# Patient Record
Sex: Female | Born: 1935 | Race: White | Hispanic: No | Smoking: Never smoker
Health system: Southern US, Community
[De-identification: ages and names within clinical notes are randomized; demographics above are authoritative.]

---

## 2014-05-23 ENCOUNTER — Emergency Department (HOSPITAL_COMMUNITY): Payer: Medicare HMO | Admitting: Anesthesiology

## 2014-05-23 ENCOUNTER — Encounter (HOSPITAL_COMMUNITY): Payer: Self-pay | Admitting: Emergency Medicine

## 2014-05-23 ENCOUNTER — Emergency Department (HOSPITAL_COMMUNITY): Payer: Medicare HMO

## 2014-05-23 ENCOUNTER — Encounter (HOSPITAL_COMMUNITY): Payer: Medicare HMO | Admitting: Anesthesiology

## 2014-05-23 ENCOUNTER — Encounter (HOSPITAL_COMMUNITY)
Admission: EM | Disposition: A | Discharge status: Home / Self Care | Payer: Self-pay | Source: Home / Self Care | Attending: Emergency Medicine

## 2014-05-23 ENCOUNTER — Ambulatory Visit (HOSPITAL_COMMUNITY)
Admission: EM | Admit: 2014-05-23 | Discharge: 2014-05-23 | Disposition: A | Discharge status: Home / Self Care | Payer: Medicare HMO | Attending: Emergency Medicine | Admitting: Emergency Medicine

## 2014-05-23 DIAGNOSIS — Y92009 Unspecified place in unspecified non-institutional (private) residence as the place of occurrence of the external cause: Secondary | ICD-10-CM | POA: Diagnosis not present

## 2014-05-23 DIAGNOSIS — W108XXA Fall (on) (from) other stairs and steps, initial encounter: Secondary | ICD-10-CM | POA: Diagnosis not present

## 2014-05-23 DIAGNOSIS — S62102B Fracture of unspecified carpal bone, left wrist, initial encounter for open fracture: Secondary | ICD-10-CM

## 2014-05-23 DIAGNOSIS — Z885 Allergy status to narcotic agent status: Secondary | ICD-10-CM | POA: Diagnosis not present

## 2014-05-23 DIAGNOSIS — S52509B Unspecified fracture of the lower end of unspecified radius, initial encounter for open fracture type I or II: Secondary | ICD-10-CM | POA: Insufficient documentation

## 2014-05-23 DIAGNOSIS — Y9301 Activity, walking, marching and hiking: Secondary | ICD-10-CM | POA: Insufficient documentation

## 2014-05-23 DIAGNOSIS — Y998 Other external cause status: Secondary | ICD-10-CM | POA: Insufficient documentation

## 2014-05-23 DIAGNOSIS — S52609B Unspecified fracture of lower end of unspecified ulna, initial encounter for open fracture type I or II: Principal | ICD-10-CM

## 2014-05-23 HISTORY — PX: OPEN REDUCTION INTERNAL FIXATION (ORIF) DISTAL RADIAL FRACTURE: SHX5989

## 2014-05-23 SURGERY — OPEN REDUCTION INTERNAL FIXATION (ORIF) DISTAL RADIUS FRACTURE
Anesthesia: General | Site: Wrist | Laterality: Left

## 2014-05-23 MED ORDER — OXYCODONE-ACETAMINOPHEN 5-325 MG PO TABS
1.0000 | ORAL_TABLET | Freq: Once | ORAL | Status: AC
  Administered 2014-05-23: 1 via ORAL
  Filled 2014-05-23: qty 1

## 2014-05-23 MED ORDER — DIPHENHYDRAMINE HCL 50 MG/ML IJ SOLN
INTRAMUSCULAR | Status: DC | PRN
  Administered 2014-05-23: 12.5 mg via INTRAVENOUS

## 2014-05-23 MED ORDER — DEXAMETHASONE SODIUM PHOSPHATE 4 MG/ML IJ SOLN
INTRAMUSCULAR | Status: DC | PRN
  Administered 2014-05-23: 8 mg via INTRAVENOUS

## 2014-05-23 MED ORDER — DEXAMETHASONE SODIUM PHOSPHATE 4 MG/ML IJ SOLN
INTRAMUSCULAR | Status: AC
  Filled 2014-05-23: qty 2

## 2014-05-23 MED ORDER — BUPIVACAINE HCL (PF) 0.25 % IJ SOLN
INTRAMUSCULAR | Status: AC
  Filled 2014-05-23: qty 30

## 2014-05-23 MED ORDER — ONDANSETRON HCL 4 MG/2ML IJ SOLN
INTRAMUSCULAR | Status: DC | PRN
  Administered 2014-05-23: 4 mg via INTRAVENOUS

## 2014-05-23 MED ORDER — SCOPOLAMINE 1 MG/3DAYS TD PT72: 1.0000 | MEDICATED_PATCH | Freq: Once | TRANSDERMAL | Status: DC

## 2014-05-23 MED ORDER — OXYCODONE HCL 5 MG/5ML PO SOLN: 5.0000 mg | Freq: Once | ORAL | Status: DC | PRN

## 2014-05-23 MED ORDER — PROPOFOL 10 MG/ML IV BOLUS
INTRAVENOUS | Status: AC
  Filled 2014-05-23: qty 20

## 2014-05-23 MED ORDER — MEPERIDINE HCL 25 MG/ML IJ SOLN: 6.2500 mg | INTRAMUSCULAR | Status: DC | PRN

## 2014-05-23 MED ORDER — LACTATED RINGERS IV SOLN
INTRAVENOUS | Status: DC | PRN
  Administered 2014-05-23 (×2): via INTRAVENOUS

## 2014-05-23 MED ORDER — FENTANYL CITRATE 0.05 MG/ML IJ SOLN
INTRAMUSCULAR | Status: AC
  Filled 2014-05-23: qty 5

## 2014-05-23 MED ORDER — CEFAZOLIN SODIUM 1-5 GM-% IV SOLN
INTRAVENOUS | Status: DC | PRN
  Administered 2014-05-23: 1 g via INTRAVENOUS

## 2014-05-23 MED ORDER — DIPHENHYDRAMINE HCL 50 MG/ML IJ SOLN: 12.5000 mg | Freq: Once | INTRAMUSCULAR | Status: DC

## 2014-05-23 MED ORDER — CEFAZOLIN SODIUM 1-5 GM-% IV SOLN
1.0000 g | Freq: Once | INTRAVENOUS | Status: AC
  Administered 2014-05-23: 1 g via INTRAVENOUS
  Filled 2014-05-23: qty 50

## 2014-05-23 MED ORDER — MORPHINE SULFATE 4 MG/ML IJ SOLN: 4.0000 mg | Freq: Once | INTRAMUSCULAR | Status: DC

## 2014-05-23 MED ORDER — FENTANYL CITRATE 0.05 MG/ML IJ SOLN
INTRAMUSCULAR | Status: DC | PRN
  Administered 2014-05-23: 25 ug via INTRAVENOUS
  Administered 2014-05-23: 50 ug via INTRAVENOUS
  Administered 2014-05-23: 75 ug via INTRAVENOUS
  Administered 2014-05-23 (×4): 25 ug via INTRAVENOUS

## 2014-05-23 MED ORDER — PROMETHAZINE HCL 25 MG/ML IJ SOLN: 6.2500 mg | INTRAMUSCULAR | Status: DC | PRN

## 2014-05-23 MED ORDER — PROPOFOL 10 MG/ML IV BOLUS
INTRAVENOUS | Status: DC | PRN
  Administered 2014-05-23: 200 mg via INTRAVENOUS

## 2014-05-23 MED ORDER — SCOPOLAMINE 1 MG/3DAYS TD PT72
MEDICATED_PATCH | TRANSDERMAL | Status: DC | PRN
  Administered 2014-05-23: 1 via TRANSDERMAL

## 2014-05-23 MED ORDER — LIDOCAINE HCL (CARDIAC) 20 MG/ML IV SOLN
INTRAVENOUS | Status: DC | PRN
  Administered 2014-05-23: 20 mg via INTRAVENOUS

## 2014-05-23 MED ORDER — LIDOCAINE HCL (CARDIAC) 20 MG/ML IV SOLN
INTRAVENOUS | Status: AC
  Filled 2014-05-23: qty 5

## 2014-05-23 MED ORDER — HYDROMORPHONE HCL PF 1 MG/ML IJ SOLN
0.2500 mg | INTRAMUSCULAR | Status: DC | PRN
  Administered 2014-05-23 (×2): 0.5 mg via INTRAVENOUS

## 2014-05-23 MED ORDER — OXYCODONE HCL 5 MG PO TABS: 5.0000 mg | ORAL_TABLET | Freq: Once | ORAL | Status: DC | PRN

## 2014-05-23 MED ORDER — MIDAZOLAM HCL 2 MG/2ML IJ SOLN
0.5000 mg | Freq: Once | INTRAMUSCULAR | Status: DC | PRN
Start: 2014-05-23 — End: 2014-05-23

## 2014-05-23 MED ORDER — BUPIVACAINE HCL (PF) 0.25 % IJ SOLN
INTRAMUSCULAR | Status: DC | PRN
  Administered 2014-05-23: 10 mL

## 2014-05-23 MED ORDER — OXYCODONE-ACETAMINOPHEN 5-325 MG PO TABS: 1.0000 | ORAL_TABLET | ORAL | Status: AC | PRN

## 2014-05-23 SURGICAL SUPPLY — 50 items
APL SKNCLS STERI-STRIP NONHPOA (GAUZE/BANDAGES/DRESSINGS) ×1
BANDAGE ELASTIC 4 VELCRO ST LF (GAUZE/BANDAGES/DRESSINGS) ×3 IMPLANT
BENZOIN TINCTURE PRP APPL 2/3 (GAUZE/BANDAGES/DRESSINGS) ×3 IMPLANT
BIT DRILL 2.2 SS TIBIAL (BIT) ×3 IMPLANT
BNDG CMPR 9X4 STRL LF SNTH (GAUZE/BANDAGES/DRESSINGS) ×1
BNDG ESMARK 4X9 LF (GAUZE/BANDAGES/DRESSINGS) ×3 IMPLANT
BNDG GAUZE ELAST 4 BULKY (GAUZE/BANDAGES/DRESSINGS) ×3 IMPLANT
CLOSURE WOUND 1/2 X4 (GAUZE/BANDAGES/DRESSINGS) ×1
CORDS BIPOLAR (ELECTRODE) ×3 IMPLANT
COVER SURGICAL LIGHT HANDLE (MISCELLANEOUS) ×3 IMPLANT
CUFF TOURNIQUET SINGLE 18IN (TOURNIQUET CUFF) ×3 IMPLANT
DRAPE OEC MINIVIEW 54X84 (DRAPES) ×3 IMPLANT
DRAPE SURG 17X23 STRL (DRAPES) ×3 IMPLANT
GAUZE SPONGE 4X4 12PLY STRL (GAUZE/BANDAGES/DRESSINGS) ×3 IMPLANT
GAUZE XEROFORM 1X8 LF (GAUZE/BANDAGES/DRESSINGS) ×3 IMPLANT
GLOVE BIO SURGEON STRL SZ8.5 (GLOVE) ×3 IMPLANT
GOWN STRL REUS W/ TWL LRG LVL3 (GOWN DISPOSABLE) ×1 IMPLANT
GOWN STRL REUS W/ TWL XL LVL3 (GOWN DISPOSABLE) ×1 IMPLANT
GOWN STRL REUS W/TWL LRG LVL3 (GOWN DISPOSABLE) ×3
GOWN STRL REUS W/TWL XL LVL3 (GOWN DISPOSABLE) ×3
KIT BASIN OR (CUSTOM PROCEDURE TRAY) ×3 IMPLANT
KIT ROOM TURNOVER OR (KITS) ×3 IMPLANT
MANIFOLD NEPTUNE II (INSTRUMENTS) ×3 IMPLANT
NEEDLE HYPO 25GX1X1/2 BEV (NEEDLE) ×3 IMPLANT
NS IRRIG 1000ML POUR BTL (IV SOLUTION) ×3 IMPLANT
PACK ORTHO EXTREMITY (CUSTOM PROCEDURE TRAY) ×3 IMPLANT
PAD ARMBOARD 7.5X6 YLW CONV (MISCELLANEOUS) ×6 IMPLANT
PAD CAST 3X4 CTTN HI CHSV (CAST SUPPLIES) ×1 IMPLANT
PAD CAST 4YDX4 CTTN HI CHSV (CAST SUPPLIES) ×1 IMPLANT
PADDING CAST COTTON 3X4 STRL (CAST SUPPLIES) ×3
PADDING CAST COTTON 4X4 STRL (CAST SUPPLIES) ×3
PEG LOCKING SMOOTH 2.2X18 (Peg) ×3 IMPLANT
PEG LOCKING SMOOTH 2.2X20 (Screw) ×12 IMPLANT
PEG LOCKING SMOOTH 2.2X22 (Screw) ×6 IMPLANT
PLATE STANDARD DVR LEFT (Plate) ×3 IMPLANT
PLATE STD DVR LT 24X51 (Plate) ×1 IMPLANT
SCREW  LP NL 2.7X10MM (Screw) ×4 IMPLANT
SCREW 2.7X12MM (Screw) ×3 IMPLANT
SCREW LP NL 2.7X10MM (Screw) ×2 IMPLANT
SPONGE GAUZE 4X4 12PLY STER LF (GAUZE/BANDAGES/DRESSINGS) ×3 IMPLANT
SPONGE SCRUB IODOPHOR (GAUZE/BANDAGES/DRESSINGS) ×3 IMPLANT
STRIP CLOSURE SKIN 1/2X4 (GAUZE/BANDAGES/DRESSINGS) ×2 IMPLANT
SUT PROLENE 3 0 PS 2 (SUTURE) ×3 IMPLANT
SUT VIC AB 3-0 FS2 27 (SUTURE) IMPLANT
SUT VICRYL 4-0 PS2 18IN ABS (SUTURE) ×3 IMPLANT
SYR CONTROL 10ML LL (SYRINGE) ×3 IMPLANT
TOWEL OR 17X24 6PK STRL BLUE (TOWEL DISPOSABLE) ×3 IMPLANT
TOWEL OR 17X26 10 PK STRL BLUE (TOWEL DISPOSABLE) ×3 IMPLANT
UNDERPAD 30X30 INCONTINENT (UNDERPADS AND DIAPERS) ×3 IMPLANT
WATER STERILE IRR 1000ML POUR (IV SOLUTION) ×3 IMPLANT

## 2014-05-23 NOTE — Anesthesia Procedure Notes (Signed)
Procedure Name: LMA Insertion Date/Time: 05/23/2014 4:17 PM Performed by: Alanda AmassFRIEDMAN, Aparna Vanderweele A Pre-anesthesia Checklist: Patient identified, Timeout performed, Emergency Drugs available, Suction available and Patient being monitored Patient Re-evaluated:Patient Re-evaluated prior to inductionOxygen Delivery Method: Circle system utilized Preoxygenation: Pre-oxygenation with 100% oxygen Intubation Type: IV induction Ventilation: Mask ventilation without difficulty LMA: LMA inserted LMA Size: 4.0 Number of attempts: 1 Placement Confirmation: positive ETCO2 and breath sounds checked- equal and bilateral Tube secured with: Tape Dental Injury: Teeth and Oropharynx as per pre-operative assessment

## 2014-05-23 NOTE — Op Note (Signed)
See note [phone]

## 2014-05-23 NOTE — ED Provider Notes (Signed)
CSN: [id number]     Arrival date & time 05/23/14  1246 History  This chart was scribed for a non-physician practitioner, Wynetta Emery, working with Doug Sou, MD by [place] Peace, ED Scribe. The patient was seen in TR09C/TR09C. The patient's care was started at 1:11 PM.    Chief Complaint  Patient presents with  . Wrist Pain  . Fall      Patient is a 80 y.o. female presenting with wrist pain and fall. The history is provided by the patient. No language interpreter was used.  Wrist Pain  Fall  HPI Comments: Marchia Diguglielmo is a 80 y.o. female who presents to the Emergency Department complaining of fall earlier today from that resulted in her injuring her left wrist. Patient was 4 steps up and she slipped over a dog that was underfoot.  Pt reports some noted deformity with associated minor abrasions to her left arm and right ankle. She denies taking any medication pta for pain. Pt further denies hitting her head at any point during fall. Pain is 8/10. Pt further reports history of osteoporosis and scoliosis. Pt is right hand dominate. Pt denies anticoagulation, head trauma, LOC, N/V, change in vision, cervicalgia, chest pain, SOB, abdominal pain, difficulty ambulating, numbness, weakness, EtOH/illicit drug/perscription drug use that would alter awareness.    History reviewed. No pertinent past medical history. History reviewed. No pertinent past surgical history. History reviewed. No pertinent family history. History  Substance Use Topics  . Smoking status: Never Smoker   . Smokeless tobacco: Not on file  . Alcohol Use: Yes     Comment: occ   OB History   Grav Para Term Preterm Abortions TAB SAB Ect Mult Living                 Review of Systems  Constitutional: Negative for fever and chills.  Musculoskeletal:       Wrist deformity.   Skin: Positive for wound.  Neurological: Negative for syncope.   A complete 10 system review of systems was obtained and all systems are  negative except as noted in the HPI and PMH.     Allergies  Tylenol with codeine #3  Home Medications   Prior to Admission medications   Medication Sig Start Date End Date Taking? Authorizing Provider  Vitamin D, Cholecalciferol, 1000 UNITS TABS Take 2,000 Units by mouth every morning.   Yes Historical Provider, MD   BP 102/43  Pulse 60  Temp(Src) 97.7 F (36.5 C) (Oral)  Resp 18  SpO2 100% Physical Exam  Nursing note and vitals reviewed. Constitutional: She is oriented to person, place, and time. She appears well-developed and well-nourished.  HENT:  Head: Normocephalic and atraumatic.  Mouth/Throat: Oropharynx is clear and moist.  No abrasions or contusions.   No hemotympanum, battle signs or raccoon's eyes  No crepitance or tenderness to palpation along the orbital rim.  EOMI intact with no pain or diplopia  No abnormal otorrhea or rhinorrhea. Nasal septum midline.  No intraoral trauma.  Eyes: Conjunctivae and EOM are normal. Pupils are equal, round, and reactive to light.  Neck: Normal range of motion. Neck supple.  No midline C-spine  tenderness to palpation or step-offs appreciated. Patient has full range of motion without pain.   Cardiovascular: Normal rate, regular rhythm and intact distal pulses.   Pulmonary/Chest: Effort normal and breath sounds normal. No respiratory distress. She has no wheezes. She has no rales. She exhibits no tenderness.  No  TTP or crepitance  Abdominal: Soft. Bowel sounds are normal. She exhibits no distension and no mass. There is no tenderness. There is no rebound and no guarding.  Musculoskeletal: Normal range of motion. She exhibits no edema and no tenderness.  Deformity to left wrist. Patient can move fingers. Cap Refill is less than 2 seconds x5 digits. Distal sensation is grossly intact. There is a 5 mm abrasion to radial side of wrist. It does not appear to communicate deeply.  Neurological: She is alert and oriented to person,  place, and time.  Strength 5/5 x4 extremities   Distal sensation intact  Skin: Skin is warm.  Psychiatric: She has a normal mood and affect.    ED Course  Procedures (including critical care time)  No results found for this or any previous visit. No results found.    Labs Review Labs Reviewed - No data to display  Imaging Review Dg Wrist 2 Views Left  05/23/2014   CLINICAL DATA:  Larey Seat down stairs, wrist pain  EXAM: LEFT WRIST - 2 VIEW  COMPARISON:  None.  FINDINGS: Acute displaced and comminuted distal radius fracture. Additionally, there is an acute and displaced fracture through the distal ulna. Fracture fragments are displaced dorsally with apex volar angulation. Additionally, there is override of the fracture fragments. Comminution of the distal radius appears to involve the articular surface. The carpal bones travel dorsally with the distal radius.  IMPRESSION: 1. Acute displaced and comminuted distal radius fracture with probable articular involvement. The distal fracture fragments are displaced dorsally and there is apex volar angulation of the fracture site. 2. Acute minimally displaced fracture of the distal ulna.   Electronically Signed   By: Malachy Moan M.D.   On: 05/23/2014 14:38     EKG Interpretation None     Medications  morphine 4 MG/ML injection 4 mg (4 mg Intravenous Not Given 05/23/14 1525)  bupivacaine (PF) (MARCAINE) 0.25 % injection (1 mL  Given 05/23/14 1553)  oxyCODONE-acetaminophen (PERCOCET/ROXICET) 5-325 MG per tablet 1 tablet (1 tablet Oral Given 05/23/14 1319)  ceFAZolin (ANCEF) IVPB 1 g/50 mL premix (0 g Intravenous Stopped 05/23/14 1528)    1:15 PM- Treatment plan was discussed with patient who verbalizes understanding and agrees.   MDM   Final diagnoses:  Wrist fracture, left, open, initial encounter    Filed Vitals:   05/23/14 1257 05/23/14 1522  BP: 102/43 140/67  Pulse: 60 83  Temp: 97.7 F (36.5 C) 98.9 F (37.2 C)  TempSrc: Oral    Resp: 18 16  SpO2: 100% 99%    Medications  morphine 4 MG/ML injection 4 mg (4 mg Intravenous Not Given 05/23/14 1525)  bupivacaine (PF) (MARCAINE) 0.25 % injection (1 mL  Given 05/23/14 1553)  oxyCODONE-acetaminophen (PERCOCET/ROXICET) 5-325 MG per tablet 1 tablet (1 tablet Oral Given 05/23/14 1319)  ceFAZolin (ANCEF) IVPB 1 g/50 mL premix (0 g Intravenous Stopped 05/23/14 1528)    Ekta Dancer is a 80 y.o. female presenting with fall and deformity to left (nondominant) wrist. She is neurovascularly intact. There is a very small abrasion to the radial side of the wrist. I do not think it communicate deeply. I do not think this is an open fracture. X-rays pending. There is a delay in x-ray reading as the PACS system is not working properly. Patient is given pain medication and advised of delay  Shared visit with the attending physician who is personally evaluated the patient. As there is fat protruding from the small laceration we will treat this  as an open fracture. IV initiated and patient given 1 g of Ancef.  X-ray reveals acute displaced and comminuted distal radius fracture with articular involvement.  Hand surgery consult from Dr. Mina MarbleWeingold appreciated: Patient will need washout in the OR.  I personally performed the services described in this documentation, which was scribed in my presence. The recorded information has been reviewed and is accurate.   Wynetta Emeryicole Dewell Monnier, PA-C 05/23/14 1637

## 2014-05-23 NOTE — ED Notes (Signed)
Splinted with padded board splint for stability; CMS intact after splint

## 2014-05-23 NOTE — ED Notes (Signed)
Pt c/o left wrist pain from trip and fall today; deformity noted; CMS intact; small abrasion noted to arm and small abrasion to right ankle; pt did not hit her head

## 2014-05-23 NOTE — Consult Note (Signed)
Reason for Consult:left distal radius fracture Referring Physician: Sallyanne KusterJacobowitz  Heather Combs is an 80 y.o. female.  HPI: s/p fall with displaced left grade 1 open distal radius fracture  History reviewed. No pertinent past medical history.  History reviewed. No pertinent past surgical history.  History reviewed. No pertinent family history.  Social History:  reports that she has never smoked. She does not have any smokeless tobacco history on file. She reports that she drinks alcohol. She reports that she does not use illicit drugs.  Allergies:  Allergies  Allergen Reactions  . Tylenol With Codeine #3 [Acetaminophen-Codeine]     Medications:  Scheduled: .  morphine injection  4 mg Intravenous Once    No results found for this or any previous visit (from the past 48 hour(s)).  Dg Wrist 2 Views Left  05/23/2014   CLINICAL DATA:  Larey SeatFell down stairs, wrist pain  EXAM: LEFT WRIST - 2 VIEW  COMPARISON:  None.  FINDINGS: Acute displaced and comminuted distal radius fracture. Additionally, there is an acute and displaced fracture through the distal ulna. Fracture fragments are displaced dorsally with apex volar angulation. Additionally, there is override of the fracture fragments. Comminution of the distal radius appears to involve the articular surface. The carpal bones travel dorsally with the distal radius.  IMPRESSION: 1. Acute displaced and comminuted distal radius fracture with probable articular involvement. The distal fracture fragments are displaced dorsally and there is apex volar angulation of the fracture site. 2. Acute minimally displaced fracture of the distal ulna.   Electronically Signed   By: Malachy MoanHeath  McCullough M.D.   On: 05/23/2014 14:38    Review of Systems  All other systems reviewed and are negative.  Blood pressure 102/43, pulse 60, temperature 97.7 F (36.5 C), temperature source Oral, resp. rate 18, SpO2 100.00%. Physical Exam  Constitutional: She is oriented to  person, place, and time. She appears well-developed and well-nourished.  HENT:  Head: Normocephalic and atraumatic.  Cardiovascular: Normal rate.   Respiratory: Effort normal.  Musculoskeletal:       Left wrist: She exhibits bony tenderness, swelling, deformity and laceration.  Grade 1 open left distal radius fracture  Neurological: She is alert and oriented to person, place, and time.  Skin: Skin is warm.  Psychiatric: She has a normal mood and affect. Her behavior is normal. Judgment and thought content normal.    Assessment/Plan: Plan I and D and ORIF above with CTR  Aidenn Skellenger A 05/23/2014, 3:17 PM

## 2014-05-23 NOTE — ED Provider Notes (Signed)
Medical screening examination/treatment/procedure(s) were conducted as a shared visit with non-physician practitioner(s) and myself.  I personally evaluated the patient during the encounter.   EKG Interpretation None       Doug SouSam Shazia Mitchener, MD 05/23/14 1815

## 2014-05-23 NOTE — ED Provider Notes (Signed)
Patient tripped and fell over her dog today injuring her left wrist.  No other injury. on exam alert Glasgow Coma Score 15 left upper extremity is a pinpoint open wound with fat protruding at the volar wrist. Wrist is deformed and tender. Radial pulse 2+   Doug SouSam Avalyn Molino, MD 05/23/14 1506

## 2014-05-23 NOTE — Transfer of Care (Signed)
Immediate Anesthesia Transfer of Care Note  Patient: Heather Combs  Procedure(s) Performed: Procedure(s): OPEN REDUCTION INTERNAL FIXATION (ORIF) DISTAL RADIAL FRACTURE (Left)  Patient Location: PACU  Anesthesia Type:General  Level of Consciousness: awake  Airway & Oxygen Therapy: Patient Spontanous Breathing  Post-op Assessment: Report given to PACU RN and Post -op Vital signs reviewed and stable  Post vital signs: Reviewed and stable  Complications: No apparent anesthesia complications

## 2014-05-23 NOTE — Anesthesia Preprocedure Evaluation (Addendum)
Anesthesia Evaluation  Patient identified by MRN, date of birth, ID band Patient awake    Reviewed: Allergy & Precautions, H&P , NPO status , Patient's Chart, lab work & pertinent test results  Airway Mallampati: II TM Distance: >3 FB Neck ROM: Full    Dental  (+) Dental Advisory Given, Caps, Chipped   Pulmonary COPD Copd per pt.  No history of smoking breath sounds clear to auscultation  Pulmonary exam normal       Cardiovascular negative cardio ROS  Rhythm:Regular Rate:Normal     Neuro/Psych negative neurological ROS     GI/Hepatic negative GI ROS, Neg liver ROS,   Endo/Other  negative endocrine ROS  Renal/GU negative Renal ROS     Musculoskeletal   Abdominal   Peds  Hematology negative hematology ROS (+)   Anesthesia Other Findings   Reproductive/Obstetrics                          Anesthesia Physical Anesthesia Plan  ASA: II  Anesthesia Plan: General   Post-op Pain Management:    Induction: Intravenous  Airway Management Planned: LMA  Additional Equipment:   Intra-op Plan:   Post-operative Plan:   Informed Consent: I have reviewed the patients History and Physical, chart, labs and discussed the procedure including the risks, benefits and alternatives for the proposed anesthesia with the patient or authorized representative who has indicated his/her understanding and acceptance.   Dental advisory given  Plan Discussed with: CRNA and Surgeon  Anesthesia Plan Comments: (Plan routine monitors, GA- LMA OK Pt declines regional for post op analgesia)        Anesthesia Quick Evaluation

## 2014-05-23 NOTE — Op Note (Signed)
NAMSharyon Medicus:  Combs, Heather             ACCOUNT NO.:  [id number]  MEDICAL RECORD NO.:  [id number]  LOCATION:  [facility]                         FACILITY:  [facility]  PHYSICIAN:  Artist PaisMatthew A. Margarett Viti, M.D.DATE OF BIRTH:  [birth year]  DATE OF PROCEDURE:  05/23/2014 DATE OF DISCHARGE:  05/23/2014                              OPERATIVE REPORT   PREOPERATIVE DIAGNOSIS:  Grade 1 open distal radius ulnar fracture, left side.  POSTOPERATIVE DIAGNOSIS:  Grade 1 open distal radius ulnar fracture, left side.  PROCEDURE:  Open reduction and internal fixation of above using DVR plate and screws.  SURGEON:  Artist PaisMatthew A. Mina MarbleWeingold, M.D.  ASSISTANT:  None.  ANESTHESIA:  General.  COMPLICATIONS:  none.  DRAINS:  None.  DESCRIPTION OF PROCEDURE:  The patient was taken to the operating suite. After induction of adequate general anesthesia, left upper extremity was prepped and draped in usual sterile fashion.  An Esmarch was used to exsanguinate the limb.  Tourniquet was inflated to 250 mmHg.  At this point in time, incision was made over the palmar aspect of the left distal forearm wrist area of the FCR tendon.  Skin was incised sharply. Dissection was carried down to the FCR tendon.  The sheath was incised. This was retracted laterally and midline, the radial artery laterally. Dissection was carried down to pronator quadratus.  This was subperiosteally stripped, revealing complex intra-articular fracture of the distal radius.  Reduction was performed with flexion ulnar deviation and slight traction.  We also released brachioradialis off the distal radial fragment to aid in reduction.  We placed a standard DVR plate volarly to the slotted hole and used fluoroscopy to determine appropriate plate position.  Once this was done, we went ahead and put two more cortical screws proximally followed by the smooth pegs distally.  Intraoperative fluoroscopy then revealed adequate reduction in AP, lateral, and oblique  view.  We then opened the small open area volar ulnarly over the distal ulnar fracture.  We dissected down to the fracture site, irrigated with a L normal saline.  Intraoperative fluoroscopy at that point in time revealed adequate reduction of the distal radioulnar joint and ulnar fragment, therefore no fixation was used.  The pronator quadratus was repaired with 0 Vicryl and the skin with a 3-0 Prolene subcuticular stitch.  Steri-Strips, 4x4s, fluffs, and a volar splint was applied, and loosely closed the open area from the ulna.  The patient tolerated the procedure well, went to the recovery room in stable fashion.     Artist PaisMatthew A. Mina MarbleWeingold, M.D.     MAW/MEDQ  D:  05/23/2014  T:  05/23/2014  Job:  [id number]

## 2014-05-23 NOTE — Anesthesia Postprocedure Evaluation (Signed)
  Anesthesia Post-op Note  Patient: Heather NettersDorothy Combs  Procedure(s) Performed: Procedure(s): OPEN REDUCTION INTERNAL FIXATION (ORIF) DISTAL RADIAL FRACTURE (Left)  Patient Location: PACU  Anesthesia Type:General  Level of Consciousness: awake, alert , oriented and patient cooperative  Airway and Oxygen Therapy: Patient Spontanous Breathing  Post-op Pain: mild  Post-op Assessment: Post-op Vital signs reviewed, Patient's Cardiovascular Status Stable, Respiratory Function Stable, Patent Airway, No signs of Nausea or vomiting and Pain level controlled  Post-op Vital Signs: Reviewed and stable  Last Vitals:  Filed Vitals:   05/23/14 1900  BP: 140/74  Pulse: 85  Temp: 36.7 C  Resp: 16    Complications: No apparent anesthesia complications

## 2014-05-24 ENCOUNTER — Encounter (HOSPITAL_COMMUNITY): Payer: Self-pay | Admitting: Orthopedic Surgery

## 2014-12-12 IMAGING — CR DG WRIST 2V*L*
1 series · 1 of 1 positions shown · non-contrast
Comparison: None.

CLINICAL DATA: Fell down stairs, wrist pain

EXAM:
LEFT WRIST - 2 VIEW

[view not recorded]
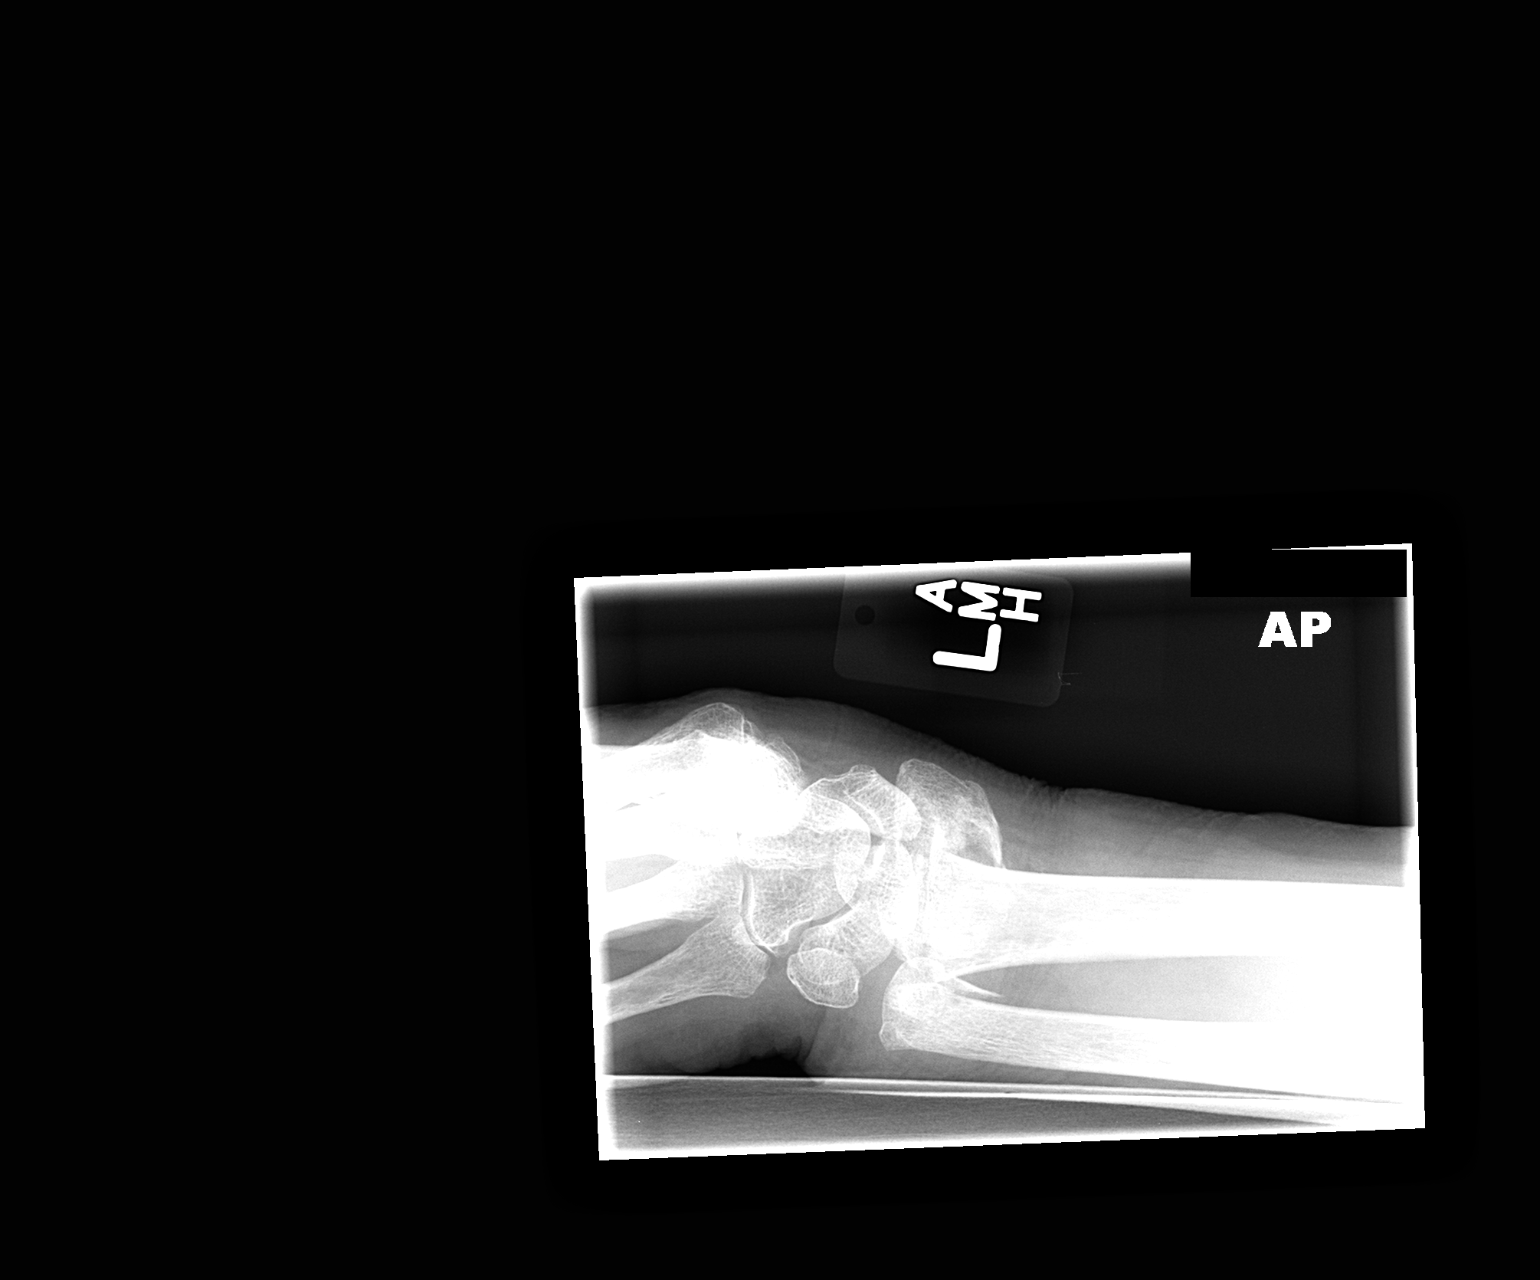

[1 of 1 positions shown; findings below may reference images not displayed]

FINDINGS: Acute displaced and comminuted distal radius fracture. Additionally,
there is an acute and displaced fracture through the distal ulna.
Fracture fragments are displaced dorsally with apex volar
angulation. Additionally, there is override of the fracture
fragments. Comminution of the distal radius appears to involve the
articular surface. The carpal bones travel dorsally with the distal
radius.
IMPRESSION: 1. Acute displaced and comminuted distal radius fracture with
probable articular involvement. The distal fracture fragments are
displaced dorsally and there is apex volar angulation of the
fracture site.
2. Acute minimally displaced fracture of the distal ulna.
